# Patient Record
Sex: Male | Born: 1980 | Race: White | Hispanic: No | Marital: Married | Smoking: Current every day smoker
Health system: Southern US, Community
[De-identification: ages and names within clinical notes are randomized; demographics above are authoritative.]

## PROBLEM LIST (undated history)

## (undated) DIAGNOSIS — R569 Unspecified convulsions: Secondary | ICD-10-CM

## (undated) DIAGNOSIS — M1990 Unspecified osteoarthritis, unspecified site: Secondary | ICD-10-CM

---
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
## 2001-04-17 ENCOUNTER — Emergency Department (HOSPITAL_COMMUNITY): Admission: EM | Admit: 2001-04-17 | Discharge: 2001-04-18 | Payer: Self-pay | Admitting: *Deleted

## 2001-04-23 ENCOUNTER — Emergency Department (HOSPITAL_COMMUNITY): Admission: EM | Admit: 2001-04-23 | Discharge: 2001-04-23 | Payer: Self-pay | Admitting: Emergency Medicine

## 2003-04-29 ENCOUNTER — Emergency Department (HOSPITAL_COMMUNITY): Admission: EM | Admit: 2003-04-29 | Discharge: 2003-04-29 | Payer: Self-pay | Admitting: Emergency Medicine

## 2004-01-20 ENCOUNTER — Inpatient Hospital Stay: Payer: Self-pay | Admitting: Psychiatry

## 2011-06-12 ENCOUNTER — Emergency Department: Payer: Self-pay | Admitting: Emergency Medicine

## 2012-01-19 ENCOUNTER — Encounter (HOSPITAL_COMMUNITY): Payer: Self-pay | Admitting: Family Medicine

## 2012-01-19 ENCOUNTER — Emergency Department (HOSPITAL_COMMUNITY): Payer: Self-pay

## 2012-01-19 ENCOUNTER — Emergency Department (HOSPITAL_COMMUNITY)
Admission: EM | Admit: 2012-01-19 | Discharge: 2012-01-19 | Disposition: A | Discharge status: Home / Self Care | Payer: Self-pay | Attending: Emergency Medicine | Admitting: Emergency Medicine

## 2012-01-19 DIAGNOSIS — M545 Low back pain: Secondary | ICD-10-CM

## 2012-01-19 DIAGNOSIS — IMO0002 Reserved for concepts with insufficient information to code with codable children: Secondary | ICD-10-CM | POA: Insufficient documentation

## 2012-01-19 DIAGNOSIS — F17200 Nicotine dependence, unspecified, uncomplicated: Secondary | ICD-10-CM | POA: Insufficient documentation

## 2012-01-19 MED ORDER — OXYCODONE-ACETAMINOPHEN 5-325 MG PO TABS: ORAL_TABLET | ORAL | Status: DC

## 2012-01-19 MED ORDER — MORPHINE SULFATE 4 MG/ML IJ SOLN
4.0000 mg | Freq: Once | INTRAMUSCULAR | Status: AC
  Administered 2012-01-19: 4 mg via INTRAMUSCULAR
  Filled 2012-01-19: qty 1

## 2012-01-19 MED ORDER — ONDANSETRON 4 MG PO TBDP
4.0000 mg | ORAL_TABLET | Freq: Once | ORAL | Status: AC
  Administered 2012-01-19: 4 mg via ORAL
  Filled 2012-01-19: qty 1

## 2012-01-19 NOTE — ED Provider Notes (Signed)
Medical screening examination/treatment/procedure(s) were performed by non-physician practitioner and as supervising physician I was immediately available for consultation/collaboration.   Reginia Battie, MD 01/19/12 2245 
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
## 2012-01-19 NOTE — ED Notes (Signed)
Patient transported to X-ray 
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
## 2012-01-19 NOTE — ED Notes (Signed)
Pt returned from xray

## 2012-01-24 ENCOUNTER — Emergency Department (HOSPITAL_COMMUNITY)
Admission: EM | Admit: 2012-01-24 | Discharge: 2012-01-24 | Disposition: A | Discharge status: Home / Self Care | Payer: Self-pay | Attending: Emergency Medicine | Admitting: Emergency Medicine

## 2012-01-24 ENCOUNTER — Encounter (HOSPITAL_COMMUNITY): Payer: Self-pay | Admitting: *Deleted

## 2012-01-24 DIAGNOSIS — IMO0002 Reserved for concepts with insufficient information to code with codable children: Secondary | ICD-10-CM | POA: Insufficient documentation

## 2012-01-24 DIAGNOSIS — M5416 Radiculopathy, lumbar region: Secondary | ICD-10-CM

## 2012-01-24 DIAGNOSIS — M79609 Pain in unspecified limb: Secondary | ICD-10-CM | POA: Insufficient documentation

## 2012-01-24 DIAGNOSIS — F17200 Nicotine dependence, unspecified, uncomplicated: Secondary | ICD-10-CM | POA: Insufficient documentation

## 2012-01-24 DIAGNOSIS — R209 Unspecified disturbances of skin sensation: Secondary | ICD-10-CM | POA: Insufficient documentation

## 2012-01-24 MED ORDER — PREDNISONE 10 MG PO TABS: ORAL_TABLET | ORAL | Status: AC

## 2012-01-24 MED ORDER — HYDROMORPHONE HCL PF 1 MG/ML IJ SOLN
1.0000 mg | Freq: Once | INTRAMUSCULAR | Status: AC
  Administered 2012-01-24: 1 mg via INTRAMUSCULAR
  Filled 2012-01-24: qty 1

## 2012-01-24 MED ORDER — DIAZEPAM 5 MG PO TABS
5.0000 mg | ORAL_TABLET | Freq: Once | ORAL | Status: AC
  Administered 2012-01-24: 5 mg via ORAL
  Filled 2012-01-24: qty 1

## 2012-01-24 MED ORDER — OXYCODONE-ACETAMINOPHEN 5-325 MG PO TABS
1.0000 | ORAL_TABLET | Freq: Once | ORAL | Status: AC
  Administered 2012-01-24: 1 via ORAL
  Filled 2012-01-24: qty 1

## 2012-01-24 MED ORDER — OXYCODONE-ACETAMINOPHEN 5-325 MG PO TABS: 1.0000 | ORAL_TABLET | ORAL | Status: AC | PRN

## 2012-01-24 MED ORDER — CYCLOBENZAPRINE HCL 10 MG PO TABS: 10.0000 mg | ORAL_TABLET | Freq: Two times a day (BID) | ORAL | Status: AC | PRN

## 2012-01-24 MED ORDER — PREDNISONE 20 MG PO TABS
60.0000 mg | ORAL_TABLET | Freq: Once | ORAL | Status: AC
  Administered 2012-01-24: 60 mg via ORAL
  Filled 2012-01-24: qty 3

## 2012-01-24 NOTE — ED Notes (Signed)
Pt was seen here last week for right hip and lower back pain and was to consult specialist and sts the pain is so bad he cannot wait. Pt reports some tingling down back of right leg

## 2012-01-24 NOTE — ED Notes (Signed)
Patient given Percocet 1 tab and water. Patient wheeled back to waiting room. Patient and signigicant other are calm and waiting patiently to transported to a room.

## 2012-01-24 NOTE — ED Notes (Signed)
Patient wheeled himself into pod A demanding to be seen by a MD. [employer] notified and assisted patient back into waiting room. Both the patient and significant other yelling/demanding to be seen by a physician. Attempted to explain to the patient their delay. Spoke with AD about the situation. Will administer Percocet 1 tablet per pain protocol orders. Took patient and significant other to fast track 5 to again explain to them the process and their delay. Both patient and significant other verbalized understanding and is in agreement with the plan.
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
## 2012-01-25 NOTE — ED Provider Notes (Signed)
Medical screening examination/treatment/procedure(s) were performed by non-physician practitioner and as supervising physician I was immediately available for consultation/collaboration.  Naoma Boxell, MD 01/25/12 0137 

## 2012-01-31 ENCOUNTER — Emergency Department (HOSPITAL_COMMUNITY)
Admission: EM | Admit: 2012-01-31 | Discharge: 2012-01-31 | Disposition: A | Discharge status: Home / Self Care | Payer: Self-pay | Source: Home / Self Care

## 2015-04-15 ENCOUNTER — Emergency Department (HOSPITAL_COMMUNITY): Payer: Self-pay

## 2015-04-15 ENCOUNTER — Encounter (HOSPITAL_COMMUNITY): Payer: Self-pay

## 2015-04-15 ENCOUNTER — Emergency Department (HOSPITAL_COMMUNITY)
Admission: EM | Admit: 2015-04-15 | Discharge: 2015-04-15 | Disposition: A | Discharge status: Home / Self Care | Payer: Self-pay | Attending: Emergency Medicine | Admitting: Emergency Medicine

## 2015-04-15 DIAGNOSIS — M5441 Lumbago with sciatica, right side: Secondary | ICD-10-CM | POA: Insufficient documentation

## 2015-04-15 DIAGNOSIS — F17210 Nicotine dependence, cigarettes, uncomplicated: Secondary | ICD-10-CM | POA: Insufficient documentation

## 2015-04-15 MED ORDER — METHOCARBAMOL 500 MG PO TABS: 500.0000 mg | ORAL_TABLET | Freq: Two times a day (BID) | ORAL | Status: AC | PRN

## 2015-04-15 MED ORDER — KETOROLAC TROMETHAMINE 60 MG/2ML IM SOLN
30.0000 mg | Freq: Once | INTRAMUSCULAR | Status: AC
  Administered 2015-04-15: 30 mg via INTRAMUSCULAR
  Filled 2015-04-15: qty 2
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
## 2015-04-15 NOTE — ED Notes (Signed)
Patient transported to X-ray 

## 2015-04-15 NOTE — Discharge Instructions (Signed)
1. Medications: Robaxin as needed for muscle spasms - This can make you very drowsy - please do not drink or drive on this medication, continue usual home medications 2. Treatment: rest, drink plenty of fluids, ice or heat to affected area 3. Follow Up: Follow up with the orthopedic clinic listed above if symptoms do not improve over the next 1-2 weeks. Return to ER for any new or worsening symptoms, any additional concerns.

## 2016-02-06 ENCOUNTER — Emergency Department (HOSPITAL_COMMUNITY): Payer: Self-pay

## 2016-02-06 ENCOUNTER — Encounter (HOSPITAL_COMMUNITY): Admission: EM | Disposition: A | Discharge status: Home Health | Payer: Self-pay | Source: Home / Self Care

## 2016-02-06 ENCOUNTER — Emergency Department (HOSPITAL_COMMUNITY): Payer: Self-pay | Admitting: Anesthesiology

## 2016-02-06 ENCOUNTER — Inpatient Hospital Stay (HOSPITAL_COMMUNITY)
Admission: EM | Admit: 2016-02-06 | Discharge: 2016-02-15 | DRG: 329 | Disposition: A | Discharge status: Home Health | Payer: Self-pay | Attending: General Surgery | Admitting: General Surgery

## 2016-02-06 ENCOUNTER — Encounter (HOSPITAL_COMMUNITY): Payer: Self-pay | Admitting: Nurse Practitioner

## 2016-02-06 DIAGNOSIS — S36498A Other injury of other part of small intestine, initial encounter: Secondary | ICD-10-CM | POA: Diagnosis present

## 2016-02-06 DIAGNOSIS — S36503A Unspecified injury of sigmoid colon, initial encounter: Secondary | ICD-10-CM | POA: Diagnosis present

## 2016-02-06 DIAGNOSIS — S36598S Other injury of other part of colon, sequela: Secondary | ICD-10-CM

## 2016-02-06 DIAGNOSIS — G5792 Unspecified mononeuropathy of left lower limb: Secondary | ICD-10-CM | POA: Diagnosis present

## 2016-02-06 DIAGNOSIS — F17210 Nicotine dependence, cigarettes, uncomplicated: Secondary | ICD-10-CM | POA: Diagnosis present

## 2016-02-06 DIAGNOSIS — Z0189 Encounter for other specified special examinations: Secondary | ICD-10-CM

## 2016-02-06 DIAGNOSIS — S31139A Puncture wound of abdominal wall without foreign body, unspecified quadrant without penetration into peritoneal cavity, initial encounter: Secondary | ICD-10-CM

## 2016-02-06 DIAGNOSIS — D62 Acute posthemorrhagic anemia: Secondary | ICD-10-CM | POA: Diagnosis not present

## 2016-02-06 DIAGNOSIS — S31631A Puncture wound without foreign body of abdominal wall, left upper quadrant with penetration into peritoneal cavity, initial encounter: Secondary | ICD-10-CM | POA: Diagnosis present

## 2016-02-06 DIAGNOSIS — W3400XA Accidental discharge from unspecified firearms or gun, initial encounter: Secondary | ICD-10-CM

## 2016-02-06 DIAGNOSIS — S36593A Other injury of sigmoid colon, initial encounter: Principal | ICD-10-CM | POA: Diagnosis present

## 2016-02-06 DIAGNOSIS — K567 Ileus, unspecified: Secondary | ICD-10-CM | POA: Diagnosis not present

## 2016-02-06 DIAGNOSIS — S36409A Unspecified injury of unspecified part of small intestine, initial encounter: Secondary | ICD-10-CM | POA: Diagnosis present

## 2016-02-06 DIAGNOSIS — G629 Polyneuropathy, unspecified: Secondary | ICD-10-CM | POA: Diagnosis present

## 2016-02-06 HISTORY — PX: COLON RESECTION: SHX5231

## 2016-02-06 HISTORY — PX: BOWEL RESECTION: SHX1257

## 2016-02-06 HISTORY — DX: Unspecified osteoarthritis, unspecified site: M19.90

## 2016-02-06 HISTORY — PX: LAPAROTOMY: SHX154

## 2016-02-06 HISTORY — PX: HERNIA REPAIR: SHX51

## 2016-02-06 HISTORY — DX: Unspecified convulsions: R56.9

## 2016-02-06 LAB — CBC
HCT: 44.3 % (ref 39.0–52.0)
HEMOGLOBIN: 15.1 g/dL (ref 13.0–17.0)
MCH: 30.2 pg (ref 26.0–34.0)
MCHC: 34.1 g/dL (ref 30.0–36.0)
MCV: 88.6 fL (ref 78.0–100.0)
PLATELETS: 249 10*3/uL (ref 150–400)
RBC: 5 MIL/uL (ref 4.22–5.81)
RDW: 13.5 % (ref 11.5–15.5)
WBC: 16.4 10*3/uL — ABNORMAL HIGH (ref 4.0–10.5)

## 2016-02-06 LAB — PREPARE FRESH FROZEN PLASMA
BLOOD PRODUCT EXPIRATION DATE: 201712262359
Blood Product Expiration Date: 201712262359
ISSUE DATE / TIME: 201712241815
ISSUE DATE / TIME: 201712241815
UNIT TYPE AND RH: 6200
UNIT TYPE AND RH: 6200

## 2016-02-06 LAB — TYPE AND SCREEN
BLOOD PRODUCT EXPIRATION DATE: 201801122359
Blood Product Expiration Date: 201801192359
ISSUE DATE / TIME: 201712241814
ISSUE DATE / TIME: 201712241814
UNIT TYPE AND RH: 9500
Unit Type and Rh: 9500

## 2016-02-06 LAB — I-STAT CHEM 8, ED
BUN: 15 mg/dL (ref 6–20)
CHLORIDE: 101 mmol/L (ref 101–111)
Calcium, Ion: 1.17 mmol/L (ref 1.15–1.40)
Creatinine, Ser: 1.1 mg/dL (ref 0.61–1.24)
Glucose, Bld: 143 mg/dL — ABNORMAL HIGH (ref 65–99)
HEMATOCRIT: 45 % (ref 39.0–52.0)
Hemoglobin: 15.3 g/dL (ref 13.0–17.0)
POTASSIUM: 3.9 mmol/L (ref 3.5–5.1)
SODIUM: 141 mmol/L (ref 135–145)
TCO2: 27 mmol/L (ref 0–100)

## 2016-02-06 LAB — ETHANOL

## 2016-02-06 LAB — COMPREHENSIVE METABOLIC PANEL
ALBUMIN: 3.9 g/dL (ref 3.5–5.0)
ALK PHOS: 91 U/L (ref 38–126)
ALT: 33 U/L (ref 17–63)
AST: 33 U/L (ref 15–41)
Anion gap: 8 (ref 5–15)
BUN: 11 mg/dL (ref 6–20)
CALCIUM: 9.6 mg/dL (ref 8.9–10.3)
CHLORIDE: 103 mmol/L (ref 101–111)
CO2: 28 mmol/L (ref 22–32)
CREATININE: 1.12 mg/dL (ref 0.61–1.24)
GFR calc Af Amer: >60 mL/min (ref >60)
GFR calc non Af Amer: >60 mL/min (ref >60)
GLUCOSE: 147 mg/dL — AB (ref 65–99)
Potassium: 3.9 mmol/L (ref 3.5–5.1)
SODIUM: 139 mmol/L (ref 135–145)
Total Bilirubin: 0.9 mg/dL (ref 0.3–1.2)
Total Protein: 6.9 g/dL (ref 6.5–8.1)

## 2016-02-06 LAB — PROTIME-INR
INR: 0.95
PROTHROMBIN TIME: 12.7 s (ref 11.4–15.2)

## 2016-02-06 LAB — I-STAT CG4 LACTIC ACID, ED: Lactic Acid, Venous: 2.7 mmol/L (ref 0.5–1.9)

## 2016-02-06 LAB — ABO/RH: ABO/RH(D): O POS

## 2016-02-06 SURGERY — LAPAROTOMY, EXPLORATORY
Anesthesia: General | Site: Abdomen

## 2016-02-06 MED ORDER — LACTATED RINGERS IV BOLUS (SEPSIS): 1000.0000 mL | Freq: Once | INTRAVENOUS | Status: DC

## 2016-02-06 MED ORDER — MIDAZOLAM HCL 2 MG/2ML IJ SOLN
INTRAMUSCULAR | Status: AC
  Filled 2016-02-06: qty 2

## 2016-02-06 MED ORDER — SUGAMMADEX SODIUM 200 MG/2ML IV SOLN
INTRAVENOUS | Status: DC | PRN
  Administered 2016-02-06: 200 mg via INTRAVENOUS

## 2016-02-06 MED ORDER — SODIUM CHLORIDE 0.9 % IV SOLN
INTRAVENOUS | Status: DC
  Administered 2016-02-07 – 2016-02-14 (×16): via INTRAVENOUS

## 2016-02-06 MED ORDER — MIDAZOLAM HCL 2 MG/2ML IJ SOLN
INTRAMUSCULAR | Status: DC | PRN
  Administered 2016-02-06: 2 mg via INTRAVENOUS

## 2016-02-06 MED ORDER — SODIUM CHLORIDE 0.9% FLUSH: 9.0000 mL | INTRAVENOUS | Status: DC | PRN

## 2016-02-06 MED ORDER — ONDANSETRON HCL 4 MG PO TABS: 4.0000 mg | ORAL_TABLET | Freq: Four times a day (QID) | ORAL | Status: DC | PRN

## 2016-02-06 MED ORDER — ENOXAPARIN SODIUM 40 MG/0.4ML ~~LOC~~ SOLN
40.0000 mg | Freq: Every day | SUBCUTANEOUS | Status: DC
  Administered 2016-02-07 – 2016-02-15 (×9): 40 mg via SUBCUTANEOUS
  Filled 2016-02-06 (×9): qty 0.4

## 2016-02-06 MED ORDER — LACTATED RINGERS IV SOLN
INTRAVENOUS | Status: DC | PRN
  Administered 2016-02-06 (×2): via INTRAVENOUS

## 2016-02-06 MED ORDER — IOPAMIDOL (ISOVUE-300) INJECTION 61%
100.0000 mL | Freq: Once | INTRAVENOUS | Status: AC | PRN
  Administered 2016-02-06: 100 mL via INTRAVENOUS

## 2016-02-06 MED ORDER — PROPOFOL 10 MG/ML IV BOLUS
INTRAVENOUS | Status: AC
  Filled 2016-02-06: qty 20

## 2016-02-06 MED ORDER — KCL IN DEXTROSE-NACL 20-5-0.45 MEQ/L-%-% IV SOLN
INTRAVENOUS | Status: DC
Start: 2016-02-06 — End: 2016-02-07
  Administered 2016-02-06: via INTRAVENOUS
  Filled 2016-02-06: qty 1000

## 2016-02-06 MED ORDER — 0.9 % SODIUM CHLORIDE (POUR BTL) OPTIME
TOPICAL | Status: DC | PRN
  Administered 2016-02-06 (×4): 1000 mL

## 2016-02-06 MED ORDER — LIDOCAINE HCL (CARDIAC) 20 MG/ML IV SOLN
INTRAVENOUS | Status: DC | PRN
  Administered 2016-02-06: 50 mg via INTRATRACHEAL

## 2016-02-06 MED ORDER — BACITRACIN ZINC 500 UNIT/GM EX OINT
TOPICAL_OINTMENT | CUTANEOUS | Status: DC | PRN
  Administered 2016-02-06: 1 via TOPICAL

## 2016-02-06 MED ORDER — ONDANSETRON HCL 4 MG/2ML IJ SOLN
INTRAMUSCULAR | Status: DC | PRN
  Administered 2016-02-06: 4 mg via INTRAVENOUS

## 2016-02-06 MED ORDER — SUFENTANIL CITRATE 50 MCG/ML IV SOLN
INTRAVENOUS | Status: DC | PRN
  Administered 2016-02-06 (×4): 10 ug via INTRAVENOUS
  Administered 2016-02-06: 20 ug via INTRAVENOUS

## 2016-02-06 MED ORDER — ONDANSETRON HCL 4 MG/2ML IJ SOLN
INTRAMUSCULAR | Status: AC
  Filled 2016-02-06: qty 2

## 2016-02-06 MED ORDER — DIPHENHYDRAMINE HCL 12.5 MG/5ML PO ELIX: 12.5000 mg | ORAL_SOLUTION | Freq: Four times a day (QID) | ORAL | Status: DC | PRN

## 2016-02-06 MED ORDER — HYDROMORPHONE 1 MG/ML IV SOLN
INTRAVENOUS | Status: DC
  Administered 2016-02-06: 22:00:00 via INTRAVENOUS
  Administered 2016-02-07: 0.6 mg via INTRAVENOUS
  Administered 2016-02-07: 3.3 mg via INTRAVENOUS
  Administered 2016-02-07: 2.7 mg via INTRAVENOUS
  Administered 2016-02-07: 6.3 mg via INTRAVENOUS
  Administered 2016-02-07: 2.1 mg via INTRAVENOUS
  Administered 2016-02-08: 09:00:00 via INTRAVENOUS
  Administered 2016-02-08: 2.4 mg via INTRAVENOUS
  Administered 2016-02-08: 1.5 mg via INTRAVENOUS
  Administered 2016-02-08: 2.1 mg via INTRAVENOUS
  Administered 2016-02-08: 2.4 mg via INTRAVENOUS
  Administered 2016-02-08: 1.8 mg via INTRAVENOUS
  Administered 2016-02-08: 0.9 mg via INTRAVENOUS
  Administered 2016-02-09: 1.4 mg via INTRAVENOUS
  Administered 2016-02-09: 3.3 mg via INTRAVENOUS
  Administered 2016-02-09: 0.9 mg via INTRAVENOUS
  Administered 2016-02-09: 0.6 mg via INTRAVENOUS
  Administered 2016-02-09: 1.5 mg via INTRAVENOUS
  Administered 2016-02-10: 13:00:00 via INTRAVENOUS
  Administered 2016-02-10: 3 mg via INTRAVENOUS
  Administered 2016-02-10: 1.2 mg via INTRAVENOUS
  Administered 2016-02-10: 2.4 mg via INTRAVENOUS
  Administered 2016-02-10: 1.5 mg via INTRAVENOUS
  Administered 2016-02-10: 3 mg via INTRAVENOUS
  Administered 2016-02-10: 2.4 mg via INTRAVENOUS
  Administered 2016-02-11: 5.4 mg via INTRAVENOUS
  Filled 2016-02-06 (×2): qty 25

## 2016-02-06 MED ORDER — HYDROMORPHONE HCL 2 MG/ML IJ SOLN: 0.5000 mg | INTRAMUSCULAR | Status: DC | PRN

## 2016-02-06 MED ORDER — CEFAZOLIN SODIUM-DEXTROSE 2-4 GM/100ML-% IV SOLN: 2.0000 g | Freq: Once | INTRAVENOUS | Status: DC

## 2016-02-06 MED ORDER — HYDROMORPHONE 1 MG/ML IV SOLN
INTRAVENOUS | Status: AC
  Filled 2016-02-06: qty 25

## 2016-02-06 MED ORDER — SUCCINYLCHOLINE CHLORIDE 200 MG/10ML IV SOSY
PREFILLED_SYRINGE | INTRAVENOUS | Status: AC
Start: 2016-02-06 — End: 2016-02-06
  Filled 2016-02-06: qty 10

## 2016-02-06 MED ORDER — LIDOCAINE 2% (20 MG/ML) 5 ML SYRINGE
INTRAMUSCULAR | Status: AC
  Filled 2016-02-06: qty 5

## 2016-02-06 MED ORDER — ROCURONIUM BROMIDE 10 MG/ML (PF) SYRINGE
PREFILLED_SYRINGE | INTRAVENOUS | Status: AC
  Filled 2016-02-06: qty 5

## 2016-02-06 MED ORDER — PROPOFOL 10 MG/ML IV BOLUS
INTRAVENOUS | Status: DC | PRN
  Administered 2016-02-06: 120 mg via INTRAVENOUS

## 2016-02-06 MED ORDER — SODIUM CHLORIDE 0.9 % IV BOLUS (SEPSIS)
1000.0000 mL | Freq: Once | INTRAVENOUS | Status: AC
  Administered 2016-02-06: 1000 mL via INTRAVENOUS

## 2016-02-06 MED ORDER — SUFENTANIL CITRATE 50 MCG/ML IV SOLN
INTRAVENOUS | Status: AC
  Filled 2016-02-06: qty 1

## 2016-02-06 MED ORDER — CEFAZOLIN SODIUM 1 G IJ SOLR
INTRAMUSCULAR | Status: AC
  Filled 2016-02-06: qty 20

## 2016-02-06 MED ORDER — ROCURONIUM BROMIDE 100 MG/10ML IV SOLN
INTRAVENOUS | Status: DC | PRN
  Administered 2016-02-06: 20 mg via INTRAVENOUS
  Administered 2016-02-06: 30 mg via INTRAVENOUS
  Administered 2016-02-06: 10 mg via INTRAVENOUS

## 2016-02-06 MED ORDER — ACETAMINOPHEN 325 MG PO TABS: 650.0000 mg | ORAL_TABLET | ORAL | Status: DC | PRN

## 2016-02-06 MED ORDER — TETANUS-DIPHTH-ACELL PERTUSSIS 5-2.5-18.5 LF-MCG/0.5 IM SUSP
0.5000 mL | Freq: Once | INTRAMUSCULAR | Status: DC
  Filled 2016-02-06: qty 0.5

## 2016-02-06 MED ORDER — ONDANSETRON HCL 4 MG/2ML IJ SOLN: 4.0000 mg | Freq: Four times a day (QID) | INTRAMUSCULAR | Status: DC | PRN

## 2016-02-06 MED ORDER — HYDROMORPHONE HCL 1 MG/ML IJ SOLN
0.2500 mg | INTRAMUSCULAR | Status: DC | PRN
  Administered 2016-02-06 (×4): 0.5 mg via INTRAVENOUS

## 2016-02-06 MED ORDER — PANTOPRAZOLE SODIUM 40 MG IV SOLR
40.0000 mg | Freq: Every day | INTRAVENOUS | Status: DC
  Administered 2016-02-07 – 2016-02-11 (×5): 40 mg via INTRAVENOUS
  Filled 2016-02-06 (×5): qty 40

## 2016-02-06 MED ORDER — DIPHENHYDRAMINE HCL 50 MG/ML IJ SOLN
12.5000 mg | Freq: Four times a day (QID) | INTRAMUSCULAR | Status: DC | PRN
  Administered 2016-02-08 – 2016-02-09 (×2): 12.5 mg via INTRAVENOUS
  Filled 2016-02-06 (×2): qty 1

## 2016-02-06 MED ORDER — PANTOPRAZOLE SODIUM 40 MG PO TBEC
40.0000 mg | DELAYED_RELEASE_TABLET | Freq: Every day | ORAL | Status: DC
  Administered 2016-02-12 – 2016-02-14 (×3): 40 mg via ORAL
  Filled 2016-02-06 (×4): qty 1

## 2016-02-06 MED ORDER — CEFAZOLIN SODIUM-DEXTROSE 2-3 GM-% IV SOLR
INTRAVENOUS | Status: DC | PRN
  Administered 2016-02-06: 2 g via INTRAVENOUS

## 2016-02-06 MED ORDER — FENTANYL CITRATE (PF) 100 MCG/2ML IJ SOLN
50.0000 ug | Freq: Once | INTRAMUSCULAR | Status: AC
  Administered 2016-02-06: 50 ug via INTRAVENOUS
  Filled 2016-02-06: qty 2

## 2016-02-06 MED ORDER — SUCCINYLCHOLINE CHLORIDE 20 MG/ML IJ SOLN
INTRAMUSCULAR | Status: DC | PRN
  Administered 2016-02-06: 100 mg via INTRAVENOUS

## 2016-02-06 MED ORDER — HYDROMORPHONE HCL 2 MG/ML IJ SOLN
INTRAMUSCULAR | Status: AC
  Administered 2016-02-06: 0.5 mg via INTRAVENOUS
  Filled 2016-02-06: qty 1

## 2016-02-06 MED ORDER — NALOXONE HCL 0.4 MG/ML IJ SOLN: 0.4000 mg | INTRAMUSCULAR | Status: DC | PRN

## 2016-02-06 MED ORDER — ONDANSETRON HCL 4 MG/2ML IJ SOLN
4.0000 mg | Freq: Four times a day (QID) | INTRAMUSCULAR | Status: DC | PRN
  Administered 2016-02-12: 4 mg via INTRAVENOUS
  Filled 2016-02-06: qty 2

## 2016-02-06 MED ORDER — SODIUM CHLORIDE 0.9 % IJ SOLN
INTRAMUSCULAR | Status: AC
  Filled 2016-02-06: qty 10

## 2016-02-06 SURGICAL SUPPLY — 55 items
BLADE SURG ROTATE 9660 (MISCELLANEOUS) ×3 IMPLANT
BNDG GAUZE ELAST 4 BULKY (GAUZE/BANDAGES/DRESSINGS) ×3 IMPLANT
CANISTER SUCTION 2500CC (MISCELLANEOUS) ×6 IMPLANT
CELLS DAT CNTRL 66122 CELL SVR (MISCELLANEOUS) IMPLANT
CHLORAPREP W/TINT 26ML (MISCELLANEOUS) ×3 IMPLANT
COVER SURGICAL LIGHT HANDLE (MISCELLANEOUS) ×3 IMPLANT
DRAPE LAPAROSCOPIC ABDOMINAL (DRAPES) ×3 IMPLANT
DRAPE WARM FLUID 44X44 (DRAPE) ×3 IMPLANT
DRSG OPSITE POSTOP 4X10 (GAUZE/BANDAGES/DRESSINGS) IMPLANT
DRSG OPSITE POSTOP 4X8 (GAUZE/BANDAGES/DRESSINGS) IMPLANT
DRSG PAD ABDOMINAL 8X10 ST (GAUZE/BANDAGES/DRESSINGS) ×3 IMPLANT
ELECT BLADE 6.5 EXT (BLADE) ×3 IMPLANT
ELECT CAUTERY BLADE 6.4 (BLADE) ×3 IMPLANT
ELECT REM PT RETURN 9FT ADLT (ELECTROSURGICAL) ×3
ELECTRODE REM PT RTRN 9FT ADLT (ELECTROSURGICAL) ×1 IMPLANT
GAUZE SPONGE 4X4 12PLY STRL (GAUZE/BANDAGES/DRESSINGS) ×3 IMPLANT
GLOVE BIO SURGEON STRL SZ 6 (GLOVE) ×3 IMPLANT
GLOVE BIO SURGEON STRL SZ 6.5 (GLOVE) ×2 IMPLANT
GLOVE BIO SURGEONS STRL SZ 6.5 (GLOVE) ×1
GLOVE BIOGEL PI IND STRL 6.5 (GLOVE) ×1 IMPLANT
GLOVE BIOGEL PI IND STRL 7.0 (GLOVE) ×2 IMPLANT
GLOVE BIOGEL PI INDICATOR 6.5 (GLOVE) ×2
GLOVE BIOGEL PI INDICATOR 7.0 (GLOVE) ×4
GLOVE SURG SIGNA 7.5 PF LTX (GLOVE) ×3 IMPLANT
GOWN STRL REUS W/ TWL LRG LVL3 (GOWN DISPOSABLE) ×1 IMPLANT
GOWN STRL REUS W/TWL 2XL LVL3 (GOWN DISPOSABLE) ×3 IMPLANT
GOWN STRL REUS W/TWL LRG LVL3 (GOWN DISPOSABLE) ×2
GOWN STRL REUS W/TWL XL LVL3 (GOWN DISPOSABLE) ×3 IMPLANT
KIT BASIN OR (CUSTOM PROCEDURE TRAY) ×3 IMPLANT
KIT ROOM TURNOVER OR (KITS) ×3 IMPLANT
LIGASURE IMPACT 36 18CM CVD LR (INSTRUMENTS) IMPLANT
NS IRRIG 1000ML POUR BTL (IV SOLUTION) ×6 IMPLANT
PACK GENERAL/GYN (CUSTOM PROCEDURE TRAY) ×3 IMPLANT
PAD ARMBOARD 7.5X6 YLW CONV (MISCELLANEOUS) ×3 IMPLANT
RELOAD PROXIMATE 75MM BLUE (ENDOMECHANICALS) ×12 IMPLANT
RELOAD PROXIMATE TA60MM BLUE (ENDOMECHANICALS) ×3 IMPLANT
RETRACTOR WND ALEXIS 25 LRG (MISCELLANEOUS) ×1 IMPLANT
RTRCTR WOUND ALEXIS 18CM MED (MISCELLANEOUS)
RTRCTR WOUND ALEXIS 25CM LRG (MISCELLANEOUS) ×3
SPONGE LAP 18X18 X RAY DECT (DISPOSABLE) ×3 IMPLANT
STAPLER GUN LINEAR PROX 60 (STAPLE) ×3 IMPLANT
STAPLER PROXIMATE 75MM BLUE (STAPLE) ×6 IMPLANT
STAPLER VISISTAT 35W (STAPLE) ×3 IMPLANT
SUCTION POOLE TIP (SUCTIONS) ×3 IMPLANT
SUT NOVA 1 T20/GS 25DT (SUTURE) ×3 IMPLANT
SUT PDS II 0 TP-1 LOOPED 60 (SUTURE) ×6 IMPLANT
SUT VIC AB 2-0 SH 18 (SUTURE) ×3 IMPLANT
SUT VIC AB 3-0 SH 18 (SUTURE) ×6 IMPLANT
SUT VICRYL AB 2 0 TIES (SUTURE) ×3 IMPLANT
SUT VICRYL AB 3 0 TIES (SUTURE) ×3 IMPLANT
TAPE CLOTH SURG 6X10 WHT LF (GAUZE/BANDAGES/DRESSINGS) ×3 IMPLANT
TOWEL OR 17X24 6PK STRL BLUE (TOWEL DISPOSABLE) ×3 IMPLANT
TOWEL OR 17X26 10 PK STRL BLUE (TOWEL DISPOSABLE) ×3 IMPLANT
TRAY FOLEY CATH 16FRSI W/METER (SET/KITS/TRAYS/PACK) ×3 IMPLANT
YANKAUER SUCT BULB TIP NO VENT (SUCTIONS) IMPLANT
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
## 2016-02-06 NOTE — Anesthesia Procedure Notes (Signed)
Procedure Name: Intubation Date/Time: 02/06/2016 8:09 PM Performed by: Sallee Hogrefe S Pre-anesthesia Checklist: Patient identified, Emergency Drugs available, Suction available, Patient being monitored and Timeout performed Patient Re-evaluated:Patient Re-evaluated prior to inductionOxygen Delivery Method: Circle system utilized Preoxygenation: Pre-oxygenation with 100% oxygen Intubation Type: IV induction, Rapid sequence and Cricoid Pressure applied Ventilation: Mask ventilation without difficulty Laryngoscope Size: Mac and 4 Grade View: Grade I Tube type: Subglottic suction tube Tube size: 7.5 mm Number of attempts: 1 Airway Equipment and Method: Stylet Placement Confirmation: ETT inserted through vocal cords under direct vision,  positive ETCO2 and breath sounds checked- equal and bilateral Secured at: 22 cm Tube secured with: Tape Dental Injury: Teeth and Oropharynx as per pre-operative assessment
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
## 2016-02-06 NOTE — Transfer of Care (Signed)
Immediate Anesthesia Transfer of Care Note  Patient: Andrew Meadows  Procedure(s) Performed: Procedure(s): EXPLORATORY LAPAROTOMY (N/A) COLON RESECTION (N/A) SMALL BOWEL RESECTION (N/A)  Patient Location: PACU  Anesthesia Type:General  Level of Consciousness: awake, alert  and oriented  Airway & Oxygen Therapy: Patient Spontanous Breathing and Patient connected to face mask oxygen  Post-op Assessment: Report given to RN and Post -op Vital signs reviewed and stable  Post vital signs: Reviewed and stable  Last Vitals:  Vitals:   02/06/16 1915 02/06/16 1942  BP: 150/83 (!) 165/102  Pulse: 67 84  Resp: 24 16  Temp:      Last Pain:  Vitals:   02/06/16 1942  TempSrc:   PainSc: 5          Complications: No apparent anesthesia complications
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
## 2016-02-06 NOTE — Anesthesia Preprocedure Evaluation (Signed)
Anesthesia Evaluation  Patient identified by MRN, date of birth, ID band Patient awake    Reviewed: Allergy & Precautions, H&P , NPO status , Patient's Chart, lab work & pertinent test results  Airway Mallampati: I  TM Distance: >3 FB Neck ROM: Full    Dental no notable dental hx. (+) Teeth Intact, Dental Advisory Given   Pulmonary Current Smoker,    Pulmonary exam normal breath sounds clear to auscultation       Cardiovascular negative cardio ROS   Rhythm:Regular Rate:Normal     Neuro/Psych negative neurological ROS  negative psych ROS   GI/Hepatic negative GI ROS, Neg liver ROS,   Endo/Other  negative endocrine ROS  Renal/GU negative Renal ROS  negative genitourinary   Musculoskeletal   Abdominal   Peds  Hematology negative hematology ROS (+)   Anesthesia Other Findings   Reproductive/Obstetrics negative OB ROS                             Anesthesia Physical Anesthesia Plan  ASA: II and emergent  Anesthesia Plan: General   Post-op Pain Management:    Induction: Intravenous, Rapid sequence and Cricoid pressure planned  Airway Management Planned: Oral ETT  Additional Equipment:   Intra-op Plan:   Post-operative Plan: Extubation in OR and Possible Post-op intubation/ventilation  Informed Consent: I have reviewed the patients History and Physical, chart, labs and discussed the procedure including the risks, benefits and alternatives for the proposed anesthesia with the patient or authorized representative who has indicated his/her understanding and acceptance.   Dental advisory given  Plan Discussed with: CRNA and Surgeon  Anesthesia Plan Comments:         Anesthesia Quick Evaluation

## 2016-02-07 ENCOUNTER — Encounter (HOSPITAL_COMMUNITY): Payer: Self-pay | Admitting: General Surgery

## 2016-02-07 DIAGNOSIS — S36503A Unspecified injury of sigmoid colon, initial encounter: Secondary | ICD-10-CM | POA: Diagnosis present

## 2016-02-07 DIAGNOSIS — S36409A Unspecified injury of unspecified part of small intestine, initial encounter: Secondary | ICD-10-CM | POA: Diagnosis present

## 2016-02-07 LAB — URINALYSIS, ROUTINE W REFLEX MICROSCOPIC
Bilirubin Urine: NEGATIVE
GLUCOSE, UA: NEGATIVE mg/dL
HGB URINE DIPSTICK: NEGATIVE
Ketones, ur: NEGATIVE mg/dL
Leukocytes, UA: NEGATIVE
Nitrite: NEGATIVE
Protein, ur: NEGATIVE mg/dL
SPECIFIC GRAVITY, URINE: 1.029 (ref 1.005–1.030)
pH: 6 (ref 5.0–8.0)

## 2016-02-07 LAB — CBC
HEMATOCRIT: 45.4 % (ref 39.0–52.0)
Hemoglobin: 15.1 g/dL (ref 13.0–17.0)
MCH: 29.5 pg (ref 26.0–34.0)
MCHC: 33.3 g/dL (ref 30.0–36.0)
MCV: 88.8 fL (ref 78.0–100.0)
Platelets: 203 10*3/uL (ref 150–400)
RBC: 5.11 MIL/uL (ref 4.22–5.81)
RDW: 13.5 % (ref 11.5–15.5)
WBC: 18.2 10*3/uL — AB (ref 4.0–10.5)

## 2016-02-07 LAB — BASIC METABOLIC PANEL
ANION GAP: 6 (ref 5–15)
BUN: 11 mg/dL (ref 6–20)
CALCIUM: 9 mg/dL (ref 8.9–10.3)
CO2: 25 mmol/L (ref 22–32)
Chloride: 106 mmol/L (ref 101–111)
Creatinine, Ser: 0.93 mg/dL (ref 0.61–1.24)
Glucose, Bld: 141 mg/dL — ABNORMAL HIGH (ref 65–99)
Potassium: 4.7 mmol/L (ref 3.5–5.1)
Sodium: 137 mmol/L (ref 135–145)
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
## 2016-02-08 ENCOUNTER — Encounter (HOSPITAL_COMMUNITY): Payer: Self-pay | Admitting: General Practice

## 2016-02-08 LAB — CDS SEROLOGY

## 2016-02-08 LAB — BLOOD PRODUCT ORDER (VERBAL) VERIFICATION
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
## 2016-02-09 LAB — CBC
HCT: 45 % (ref 39.0–52.0)
HEMOGLOBIN: 15.5 g/dL (ref 13.0–17.0)
MCH: 29.8 pg (ref 26.0–34.0)
MCHC: 34.4 g/dL (ref 30.0–36.0)
MCV: 86.4 fL (ref 78.0–100.0)
Platelets: 191 10*3/uL (ref 150–400)
RBC: 5.21 MIL/uL (ref 4.22–5.81)
RDW: 13.1 % (ref 11.5–15.5)
WBC: 16.1 10*3/uL — ABNORMAL HIGH (ref 4.0–10.5)

## 2016-02-09 NOTE — Consult Note (Signed)
WOC Nurse wound consult note Reason for Consult: Consult requested for abd Vac dressing change.  Surgical PA at the bedside to assess wound during the first post-op dressing change. Wound type: Full thickness post-op midline abd wound Measurement: 15.5X2.5X.3cm Wound bed: beefy red Drainage (amount, consistency, odor) small amt blood-tinged drainage Periwound: Intact skin surrounding Dressing procedure/placement/frequency:  Applied one piece black sponge; pt has PCA and tolerated with minimal amt discomfort.  Plan for bedside nurses to change Q M/W/F. Please re-consult if further assistance is needed.  Thank-you,  Cammie Mcgeeawn Lando Alcalde MSN, RN, CWOCN, [place]-AP, CNS [phone]
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
## 2016-02-10 ENCOUNTER — Inpatient Hospital Stay (HOSPITAL_COMMUNITY): Payer: Self-pay

## 2016-02-10 MED ORDER — METOPROLOL TARTRATE 5 MG/5ML IV SOLN
5.0000 mg | Freq: Once | INTRAVENOUS | Status: AC
  Administered 2016-02-10: 5 mg via INTRAVENOUS
  Filled 2016-02-10: qty 5

## 2016-02-10 MED ORDER — METOPROLOL TARTRATE 5 MG/5ML IV SOLN
5.0000 mg | Freq: Once | INTRAVENOUS | Status: AC
Start: 2016-02-10 — End: 2016-02-10
  Administered 2016-02-10: 5 mg via INTRAVENOUS
  Filled 2016-02-10: qty 5

## 2016-02-10 MED ORDER — PHENOL 1.4 % MT LIQD
1.0000 | OROMUCOSAL | Status: DC | PRN
  Administered 2016-02-10: 1 via OROMUCOSAL
  Filled 2016-02-10: qty 177

## 2016-02-10 NOTE — Progress Notes (Signed)
0715 Pt's abd is distended, tight. C/o hard to breath due to the pressure of his abd. Nauseated, belching. Not passing flatus.  Marisue IvanLiz PA notified and seen pt.  0745 NGT Fr 16 placed, clear pale green 800ml output right after inserting the tube. Connected to low intermittent suction. Pt feeling a little better.
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
## 2016-02-10 NOTE — Progress Notes (Signed)
@   0021 Pt noted with BP 145/120, pulse 112. resp-24, O2sats at 96% RA. Pt c/o of abdomen getting bigger.Noted abdomen  very distended with very faint BS. Also c/o of feeling hard to breath. Called on call Dr. Lindie SpruceWyatt and ordered Lopressor 5 mg IV x1 and advised pt to back down on liquids.

## 2016-02-11 LAB — CBC
HCT: 38.3 % — ABNORMAL LOW (ref 39.0–52.0)
HEMOGLOBIN: 13.1 g/dL (ref 13.0–17.0)
MCH: 29.6 pg (ref 26.0–34.0)
MCHC: 34.2 g/dL (ref 30.0–36.0)
MCV: 86.5 fL (ref 78.0–100.0)
PLATELETS: 190 10*3/uL (ref 150–400)
RBC: 4.43 MIL/uL (ref 4.22–5.81)
RDW: 13.2 % (ref 11.5–15.5)
WBC: 6.2 10*3/uL (ref 4.0–10.5)

## 2016-02-11 LAB — BASIC METABOLIC PANEL
ANION GAP: 6 (ref 5–15)
BUN: 16 mg/dL (ref 6–20)
CALCIUM: 7.8 mg/dL — AB (ref 8.9–10.3)
CO2: 26 mmol/L (ref 22–32)
CREATININE: 0.72 mg/dL (ref 0.61–1.24)
Chloride: 102 mmol/L (ref 101–111)
GLUCOSE: 110 mg/dL — AB (ref 65–99)
Potassium: 3.8 mmol/L (ref 3.5–5.1)
Sodium: 134 mmol/L — ABNORMAL LOW (ref 135–145)

## 2016-02-11 MED ORDER — HYDROMORPHONE HCL 2 MG/ML IJ SOLN
1.0000 mg | INTRAMUSCULAR | Status: DC | PRN
Start: 2016-02-11 — End: 2016-02-14
  Administered 2016-02-11 – 2016-02-13 (×18): 1 mg via INTRAVENOUS
  Filled 2016-02-11 (×18): qty 1

## 2016-02-11 MED ORDER — BISACODYL 10 MG RE SUPP: 10.0000 mg | Freq: Every day | RECTAL | Status: DC | PRN

## 2016-02-11 MED ORDER — METHOCARBAMOL 1000 MG/10ML IJ SOLN: 750.0000 mg | Freq: Four times a day (QID) | INTRAMUSCULAR | Status: DC | PRN

## 2016-02-11 NOTE — Progress Notes (Signed)
0900 NGT clamped. Abd is soft, no nausea, no vomiting. Diet advanced to clear liquids this afternoon, tolerated. Had BM x2 today. Abd dressing changed today, wound vac maint.
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
## 2016-02-12 MED ORDER — METHOCARBAMOL 750 MG PO TABS
750.0000 mg | ORAL_TABLET | Freq: Three times a day (TID) | ORAL | Status: DC
  Administered 2016-02-12 – 2016-02-14 (×9): 750 mg via ORAL
  Filled 2016-02-12 (×9): qty 1

## 2016-02-12 MED ORDER — OXYCODONE-ACETAMINOPHEN 5-325 MG PO TABS
1.0000 | ORAL_TABLET | ORAL | Status: DC | PRN
  Administered 2016-02-12 – 2016-02-15 (×7): 2 via ORAL
  Filled 2016-02-12 (×7): qty 2

## 2016-02-12 MED ORDER — IBUPROFEN 600 MG PO TABS
600.0000 mg | ORAL_TABLET | Freq: Four times a day (QID) | ORAL | Status: DC | PRN
  Administered 2016-02-12 – 2016-02-14 (×4): 600 mg via ORAL
  Filled 2016-02-12 (×4): qty 1
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
## 2016-02-13 ENCOUNTER — Inpatient Hospital Stay (HOSPITAL_COMMUNITY): Payer: Self-pay

## 2016-02-13 DIAGNOSIS — M7989 Other specified soft tissue disorders: Secondary | ICD-10-CM

## 2016-02-13 LAB — CREATININE, SERUM
Creatinine, Ser: 0.71 mg/dL (ref 0.61–1.24)
GFR calc Af Amer: >60 mL/min (ref >60)

## 2016-02-13 LAB — COMPREHENSIVE METABOLIC PANEL
ALK PHOS: 113 U/L (ref 38–126)
ALT: 28 U/L (ref 17–63)
AST: 40 U/L (ref 15–41)
Albumin: 2.1 g/dL — ABNORMAL LOW (ref 3.5–5.0)
Anion gap: 7 (ref 5–15)
BILIRUBIN TOTAL: 2 mg/dL — AB (ref 0.3–1.2)
BUN: 10 mg/dL (ref 6–20)
CO2: 25 mmol/L (ref 22–32)
CREATININE: 0.66 mg/dL (ref 0.61–1.24)
Calcium: 8.2 mg/dL — ABNORMAL LOW (ref 8.9–10.3)
Chloride: 99 mmol/L — ABNORMAL LOW (ref 101–111)
GFR calc Af Amer: >60 mL/min (ref >60)
GLUCOSE: 119 mg/dL — AB (ref 65–99)
POTASSIUM: 3.3 mmol/L — AB (ref 3.5–5.1)
Sodium: 131 mmol/L — ABNORMAL LOW (ref 135–145)
TOTAL PROTEIN: 5.3 g/dL — AB (ref 6.5–8.1)
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
## 2016-02-13 NOTE — Progress Notes (Signed)
VASCULAR LAB PRELIMINARY  PRELIMINARY  PRELIMINARY  PRELIMINARY  Left lower extremity venous duplex (including iliac) completed.    Preliminary report:  There is no DVT or SVT noted in the left lower extremity or in the iliac vein.   Inola Lisle, RVT 02/13/2016, 2:35 PM
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
## 2016-02-14 LAB — CBC
HCT: 31.9 % — ABNORMAL LOW (ref 39.0–52.0)
Hemoglobin: 11.2 g/dL — ABNORMAL LOW (ref 13.0–17.0)
MCH: 30.4 pg (ref 26.0–34.0)
MCHC: 35.1 g/dL (ref 30.0–36.0)
MCV: 86.4 fL (ref 78.0–100.0)
PLATELETS: 215 10*3/uL (ref 150–400)
RBC: 3.69 MIL/uL — ABNORMAL LOW (ref 4.22–5.81)
RDW: 13.1 % (ref 11.5–15.5)
WBC: 6.6 10*3/uL (ref 4.0–10.5)

## 2016-02-14 MED ORDER — HYDROMORPHONE HCL 2 MG/ML IJ SOLN
1.0000 mg | INTRAMUSCULAR | Status: DC | PRN
  Administered 2016-02-14: 1 mg via INTRAVENOUS
  Filled 2016-02-14: qty 1
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
## 2016-02-15 DIAGNOSIS — D62 Acute posthemorrhagic anemia: Secondary | ICD-10-CM | POA: Diagnosis not present

## 2016-02-15 DIAGNOSIS — G5792 Unspecified mononeuropathy of left lower limb: Secondary | ICD-10-CM | POA: Diagnosis present

## 2016-02-15 MED ORDER — POLYETHYLENE GLYCOL 3350 17 G PO PACK
17.0000 g | PACK | Freq: Every day | ORAL | Status: DC
  Filled 2016-02-15: qty 1

## 2016-02-15 MED ORDER — DOCUSATE SODIUM 100 MG PO CAPS
100.0000 mg | ORAL_CAPSULE | Freq: Two times a day (BID) | ORAL | Status: DC
  Filled 2016-02-15: qty 1

## 2016-02-15 MED ORDER — OXYCODONE HCL 5 MG PO TABS
5.0000 mg | ORAL_TABLET | ORAL | Status: DC | PRN
  Administered 2016-02-15 (×3): 15 mg via ORAL
  Filled 2016-02-15 (×3): qty 3

## 2016-02-15 MED ORDER — OXYCODONE-ACETAMINOPHEN 7.5-325 MG PO TABS: 1.0000 | ORAL_TABLET | ORAL | 0 refills | Status: AC | PRN

## 2016-02-15 MED ORDER — METHOCARBAMOL 500 MG PO TABS
1000.0000 mg | ORAL_TABLET | Freq: Four times a day (QID) | ORAL | Status: DC | PRN
  Administered 2016-02-15: 1000 mg via ORAL
  Filled 2016-02-15: qty 2

## 2016-02-15 MED ORDER — HYDROMORPHONE HCL 2 MG/ML IJ SOLN: 0.5000 mg | INTRAMUSCULAR | Status: DC | PRN

## 2016-02-15 NOTE — Progress Notes (Signed)
Granville Lewisonald R Xxxstanfield to be D/C'd  per MD order. Discussed with the patient and all questions fully answered.  VSS, Skin clean, dry and intact without evidence of skin break down, no evidence of skin tears noted.  IV catheter discontinued intact. Site without signs and symptoms of complications. Dressing and pressure applied.  An After Visit Summary was printed and given to the patient. Patient received prescription.  D/c education completed with patient/family including follow up instructions, medication list, d/c activities limitations if indicated, with other d/c instructions as indicated by MD - patient able to verbalize understanding, all questions fully answered.   Patient instructed to return to ED, call 911, or call MD for any changes in condition.   Patient to be escorted via WC, and D/C home via private auto.
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
## 2016-02-15 NOTE — Discharge Instructions (Signed)
No lifting more than 10 pounds for 6 weeks.  No driving while taking oxycodone.  Change abdominal dressing with moist gauze twice daily.  You may shower with bandages off but do not soak in tub.
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
## 2016-02-16 ENCOUNTER — Encounter (HOSPITAL_COMMUNITY): Payer: Self-pay
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
